# Patient Record
Sex: Female | Born: 1965 | Race: White | Hispanic: No | Marital: Married | Smoking: Current every day smoker
Health system: Southern US, Community
[De-identification: ages and names within clinical notes are randomized; demographics above are authoritative.]

## PROBLEM LIST (undated history)

## (undated) DIAGNOSIS — N289 Disorder of kidney and ureter, unspecified: Secondary | ICD-10-CM

## (undated) DIAGNOSIS — F419 Anxiety disorder, unspecified: Secondary | ICD-10-CM

## (undated) DIAGNOSIS — Z905 Acquired absence of kidney: Secondary | ICD-10-CM

## (undated) HISTORY — DX: Acquired absence of kidney: Z90.5

## (undated) HISTORY — DX: Anxiety disorder, unspecified: F41.9

## (undated) HISTORY — DX: Disorder of kidney and ureter, unspecified: N28.9

## (undated) HISTORY — PX: ABDOMINAL HYSTERECTOMY: SHX81

## (undated) HISTORY — PX: BLADDER SUSPENSION: SHX72

---

## 2005-12-01 HISTORY — PX: AUGMENTATION MAMMAPLASTY: SUR837

## 2014-12-01 HISTORY — PX: LAPAROSCOPIC GASTRIC SLEEVE RESECTION: SHX5895

## 2020-10-02 HISTORY — PX: RADIOFREQUENCY ABLATION NERVES: SUR1070

## 2021-02-11 ENCOUNTER — Ambulatory Visit: Payer: Self-pay | Admitting: Family Medicine

## 2021-02-13 ENCOUNTER — Other Ambulatory Visit: Payer: Self-pay

## 2021-02-13 ENCOUNTER — Ambulatory Visit (INDEPENDENT_AMBULATORY_CARE_PROVIDER_SITE_OTHER): Payer: 59

## 2021-02-13 ENCOUNTER — Ambulatory Visit (INDEPENDENT_AMBULATORY_CARE_PROVIDER_SITE_OTHER): Payer: 59 | Admitting: Family Medicine

## 2021-02-13 ENCOUNTER — Encounter: Payer: Self-pay | Admitting: Family Medicine

## 2021-02-13 VITALS — BP 119/80 | HR 92 | Temp 97.3°F | Ht 63.5 in | Wt 158.5 lb

## 2021-02-13 DIAGNOSIS — R911 Solitary pulmonary nodule: Secondary | ICD-10-CM | POA: Diagnosis not present

## 2021-02-13 DIAGNOSIS — F411 Generalized anxiety disorder: Secondary | ICD-10-CM | POA: Insufficient documentation

## 2021-02-13 DIAGNOSIS — M5412 Radiculopathy, cervical region: Secondary | ICD-10-CM

## 2021-02-13 DIAGNOSIS — Z87898 Personal history of other specified conditions: Secondary | ICD-10-CM

## 2021-02-13 DIAGNOSIS — G2581 Restless legs syndrome: Secondary | ICD-10-CM

## 2021-02-13 DIAGNOSIS — Z85820 Personal history of malignant melanoma of skin: Secondary | ICD-10-CM

## 2021-02-13 DIAGNOSIS — Z1231 Encounter for screening mammogram for malignant neoplasm of breast: Secondary | ICD-10-CM

## 2021-02-13 DIAGNOSIS — Z905 Acquired absence of kidney: Secondary | ICD-10-CM

## 2021-02-13 DIAGNOSIS — Z01419 Encounter for gynecological examination (general) (routine) without abnormal findings: Secondary | ICD-10-CM

## 2021-02-13 MED ORDER — ALPRAZOLAM 1 MG PO TABS: 1.0000 mg | ORAL_TABLET | Freq: Every evening | ORAL | 2 refills | Status: DC | PRN

## 2021-02-13 MED ORDER — GABAPENTIN 300 MG PO CAPS: 300.0000 mg | ORAL_CAPSULE | Freq: Three times a day (TID) | ORAL | 3 refills | Status: DC

## 2021-02-13 MED ORDER — OMEPRAZOLE 40 MG PO CPDR: 40.0000 mg | DELAYED_RELEASE_CAPSULE | Freq: Every day | ORAL | 1 refills | Status: DC

## 2021-02-13 NOTE — Assessment & Plan Note (Addendum)
She continues to have severe pain with radicular symptoms.  Updated MRI ordered.  She has norco which she uses sparingly.   Will increase gabapentin to 300mg  TID.

## 2021-02-13 NOTE — Patient Instructions (Signed)
Great to meet you today! Have labs completed You will be called to schedule imaging and referral appointments.  Increase gabapentin to 300mg  three times per day. See me again in 3 months.

## 2021-02-13 NOTE — Progress Notes (Signed)
Lisa Gill - 55 y.o. female MRN [id number]  Date of birth: [birth year]  Subjective Chief Complaint  Patient presents with  . Establish Care    HPI Lisa Gill is a 55 y.o. female here today for initial visit to establish care.  She has history of solitary kidney (secondary to kidney atrophy), GAD, chronic neck pain, nicotine dependence, history of melanoma and RLS.   She has had neck pain for a few years.  Pain is radicular in nature with radiation into the L arm.  She has tried PT, injections and nerve ablation without significant improvement.  She is currently taking gabapentin 100mg  TID.  She is prescribed norco however she is not taking this very often.  She get as good or better relief from ibuprofen.  She does try to limit her NSAIDS as well due to solitary kidney.     She has noticed strong odor to her urine.  She denies dysuria or frequency.    She is prescribed alprazolam for anxiety and insomnia.  She takes 1mg  qhs.  She does avoid taking this with pain medication.  She has trazodone as well for insomnia but this make her RLS worse.  She has tried several other medications for anxiety including buspar, vistaril, lexapro, fluoxetine, and bupropion.  She did not have any improvement with several of these.  She had side effects of worsening anxiety with bupropion and anorgasmia with lexapro.    She is a current smoker.  Smokes about 1ppd.  Reports previous nodule noted on previous xray in 2018 but this was never followed up.  Denies respiratory symptoms.   She requests referral to dermatology for history of melanoma.  Requests referral to GYN for female care/well woman exams.    ROS:  A comprehensive ROS was completed and negative except as noted per HPI  No Known Allergies  Past Medical History:  Diagnosis Date  . Anxiety   . Kidney disease    One fully functioning kidney    Past Surgical History:  Procedure Laterality Date  . ABDOMINAL HYSTERECTOMY    . BLADDER  SUSPENSION    . LAPAROSCOPIC GASTRIC SLEEVE RESECTION  12/01/2014  . RADIOFREQUENCY ABLATION NERVES  10/02/2020    Social History   Socioeconomic History  . Marital status: Married    Spouse name: Leshae Mcclay  . Number of children: Not on file  . Years of education: Not on file  . Highest education level: Not on file  Occupational History  . Occupation: Homemaker  Tobacco Use  . Smoking status: Current Every Day Smoker    Packs/day: 1.00    Years: 25.00    Pack years: 25.00    Types: Cigarettes  . Smokeless tobacco: Never Used  Vaping Use  . Vaping Use: Never used  Substance and Sexual Activity  . Alcohol use: Yes    Alcohol/week: 0.0 - 1.0 standard drinks    Comment: Social  . Drug use: Never  . Sexual activity: Yes    Partners: Male  Other Topics Concern  . Not on file  Social History Narrative  . Not on file   Social Determinants of Health   Financial Resource Strain: Not on file  Food Insecurity: Not on file  Transportation Needs: Not on file  Physical Activity: Not on file  Stress: Not on file  Social Connections: Not on file    Family History  Problem Relation Age of Onset  . Uterine cancer Mother   . Diabetes Maternal Grandmother  Health Maintenance  Topic Date Due  . Hepatitis C Screening  Never done  . HIV Screening  Never done  . TETANUS/TDAP  Never done  . PAP SMEAR-Modifier  Never done  . COLONOSCOPY (Pts 45-83yrs Insurance coverage will need to be confirmed)  Never done  . MAMMOGRAM  Never done  . COVID-19 Vaccine (3 - Booster for Pfizer series) 12/30/2020  . INFLUENZA VACCINE  04/01/2021 (Originally 07/01/2020)  . HPV VACCINES  Aged Out     ----------------------------------------------------------------------------------------------------------------------------------------------------------------------------------------------------------------- Physical Exam BP 119/80 (BP Location: Left Arm, Patient Position: Sitting, Cuff Size:  Normal)   Pulse 92   Temp (!) 97.3 F (36.3 C)   Ht 5' 3.5" (1.613 m)   Wt 158 lb 8 oz (71.9 kg)   SpO2 100%   BMI 27.64 kg/m   Physical Exam Constitutional:      Appearance: Normal appearance.  Eyes:     General: No scleral icterus. Cardiovascular:     Rate and Rhythm: Normal rate and regular rhythm.  Pulmonary:     Effort: Pulmonary effort is normal.     Breath sounds: Normal breath sounds.  Musculoskeletal:     Cervical back: Neck supple.  Skin:    General: Skin is warm and dry.  Neurological:     General: No focal deficit present.     Mental Status: She is alert.  Psychiatric:        Mood and Affect: Mood normal.        Behavior: Behavior normal.     ------------------------------------------------------------------------------------------------------------------------------------------------------------------------------------------------------------------- Assessment and Plan  GAD (generalized anxiety disorder) She will continue alprazolam qhs for now.  Ideally would like to have her on SSRI type medication.  We can consider trying trintellix since she has tried several other medications.    History of solitary pulmonary nodule Counseled on smoking cessation CXR ordered.   Solitary kidney, acquired Limit NSAIDS.  Update renal function and check UA.  Cervical radiculopathy She continues to have severe pain with radicular symptoms.  Updated MRI ordered.  She has norco which she uses sparingly.   Will increase gabapentin to 300mg  TID.   Restless legs Increase of gabapentin may be helpful.  Check iron panel and TSH  History of melanoma Referral placed to dermatology.    Meds ordered this encounter  Medications  . omeprazole (PRILOSEC) 40 MG capsule    Sig: Take 1 capsule (40 mg total) by mouth daily.    Dispense:  90 capsule    Refill:  1  . ALPRAZolam (XANAX) 1 MG tablet    Sig: Take 1 tablet (1 mg total) by mouth at bedtime as needed for  anxiety.    Dispense:  30 tablet    Refill:  2  . gabapentin (NEURONTIN) 300 MG capsule    Sig: Take 1 capsule (300 mg total) by mouth 3 (three) times daily.    Dispense:  90 capsule    Refill:  3    Return in about 3 months (around 05/16/2021) for RLS/anxiety.    This visit occurred during the SARS-CoV-2 public health emergency.  Safety protocols were in place, including screening questions prior to the visit, additional usage of staff PPE, and extensive cleaning of exam room while observing appropriate contact time as indicated for disinfecting solutions.

## 2021-02-13 NOTE — Assessment & Plan Note (Signed)
Referral placed to dermatology

## 2021-02-13 NOTE — Assessment & Plan Note (Signed)
Increase of gabapentin may be helpful.  Check iron panel and TSH

## 2021-02-13 NOTE — Assessment & Plan Note (Signed)
Limit NSAIDS.  Update renal function and check UA.

## 2021-02-13 NOTE — Assessment & Plan Note (Signed)
She will continue alprazolam qhs for now.  Ideally would like to have her on SSRI type medication.  We can consider trying trintellix since she has tried several other medications.

## 2021-02-13 NOTE — Assessment & Plan Note (Signed)
Counseled on smoking cessation CXR ordered.

## 2021-02-14 ENCOUNTER — Encounter: Payer: Self-pay | Admitting: Family Medicine

## 2021-02-14 LAB — CBC
HCT: 42.2 % (ref 35.0–45.0)
Hemoglobin: 14.3 g/dL (ref 11.7–15.5)
MCH: 30.3 pg (ref 27.0–33.0)
MCHC: 33.9 g/dL (ref 32.0–36.0)
MCV: 89.4 fL (ref 80.0–100.0)
MPV: 11.2 fL (ref 7.5–12.5)
Platelets: 272 10*3/uL (ref 140–400)
RBC: 4.72 10*6/uL (ref 3.80–5.10)
RDW: 13.2 % (ref 11.0–15.0)
WBC: 6.8 10*3/uL (ref 3.8–10.8)

## 2021-02-14 LAB — URINALYSIS, ROUTINE W REFLEX MICROSCOPIC
Bilirubin Urine: NEGATIVE
Glucose, UA: NEGATIVE
Hgb urine dipstick: NEGATIVE
Hyaline Cast: NONE SEEN /LPF
Ketones, ur: NEGATIVE
Nitrite: POSITIVE — AB
Protein, ur: NEGATIVE
RBC / HPF: NONE SEEN /HPF (ref 0–2)
Specific Gravity, Urine: 1.014 (ref 1.001–1.03)
Squamous Epithelial / HPF: NONE SEEN /HPF (ref <=5)
pH: 5.5 (ref 5.0–8.0)

## 2021-02-14 LAB — COMPLETE METABOLIC PANEL WITH GFR
AG Ratio: 2 (calc) (ref 1.0–2.5)
ALT: 14 U/L (ref 6–29)
AST: 18 U/L (ref 10–35)
Albumin: 4.5 g/dL (ref 3.6–5.1)
Alkaline phosphatase (APISO): 78 U/L (ref 37–153)
BUN: 19 mg/dL (ref 7–25)
CO2: 28 mmol/L (ref 20–32)
Calcium: 10.1 mg/dL (ref 8.6–10.4)
Chloride: 103 mmol/L (ref 98–110)
Creat: 0.99 mg/dL (ref 0.50–1.05)
GFR, Est African American: 75 mL/min/{1.73_m2} (ref >=60)
GFR, Est Non African American: 65 mL/min/{1.73_m2} (ref >=60)
Globulin: 2.3 g/dL (calc) (ref 1.9–3.7)
Glucose, Bld: 86 mg/dL (ref 65–99)
Potassium: 4.4 mmol/L (ref 3.5–5.3)
Sodium: 140 mmol/L (ref 135–146)
Total Bilirubin: 0.3 mg/dL (ref 0.2–1.2)
Total Protein: 6.8 g/dL (ref 6.1–8.1)

## 2021-02-14 LAB — IRON,TIBC AND FERRITIN PANEL
%SAT: 19 % (calc) (ref 16–45)
Ferritin: 10 ng/mL — ABNORMAL LOW (ref 16–232)
Iron: 108 ug/dL (ref 45–160)
TIBC: 567 mcg/dL (calc) — ABNORMAL HIGH (ref 250–450)

## 2021-02-14 LAB — TSH: TSH: 0.75 mIU/L

## 2021-02-20 ENCOUNTER — Ambulatory Visit (INDEPENDENT_AMBULATORY_CARE_PROVIDER_SITE_OTHER): Payer: 59

## 2021-02-20 ENCOUNTER — Other Ambulatory Visit: Payer: Self-pay

## 2021-02-20 DIAGNOSIS — Z1231 Encounter for screening mammogram for malignant neoplasm of breast: Secondary | ICD-10-CM | POA: Diagnosis not present

## 2021-03-02 ENCOUNTER — Ambulatory Visit (INDEPENDENT_AMBULATORY_CARE_PROVIDER_SITE_OTHER): Payer: 59

## 2021-03-02 ENCOUNTER — Other Ambulatory Visit: Payer: Self-pay

## 2021-03-02 DIAGNOSIS — M25512 Pain in left shoulder: Secondary | ICD-10-CM | POA: Diagnosis not present

## 2021-03-02 DIAGNOSIS — M25511 Pain in right shoulder: Secondary | ICD-10-CM

## 2021-03-02 DIAGNOSIS — M5412 Radiculopathy, cervical region: Secondary | ICD-10-CM

## 2021-03-02 DIAGNOSIS — R200 Anesthesia of skin: Secondary | ICD-10-CM

## 2021-03-02 DIAGNOSIS — M542 Cervicalgia: Secondary | ICD-10-CM | POA: Diagnosis not present

## 2021-03-07 ENCOUNTER — Encounter: Payer: 59 | Admitting: Obstetrics & Gynecology

## 2021-03-11 NOTE — Progress Notes (Signed)
It appears that you added on.  Culture never resulted.  Can we check on this?  Thanks!

## 2021-03-12 ENCOUNTER — Other Ambulatory Visit: Payer: Self-pay | Admitting: Family Medicine

## 2021-03-12 ENCOUNTER — Other Ambulatory Visit: Payer: Self-pay

## 2021-03-12 DIAGNOSIS — M47812 Spondylosis without myelopathy or radiculopathy, cervical region: Secondary | ICD-10-CM

## 2021-03-12 MED ORDER — FLUCONAZOLE 150 MG PO TABS: 150.0000 mg | ORAL_TABLET | Freq: Once | ORAL | 0 refills | Status: AC

## 2021-03-12 MED ORDER — NITROFURANTOIN MONOHYD MACRO 100 MG PO CAPS: 100.0000 mg | ORAL_CAPSULE | Freq: Two times a day (BID) | ORAL | 0 refills | Status: DC

## 2021-03-12 NOTE — Progress Notes (Signed)
Patient states she gets yeast infections from macrobid. Requested med.   Please advise

## 2021-03-12 NOTE — Progress Notes (Signed)
Fluconazole sent in.   CM

## 2021-03-14 ENCOUNTER — Other Ambulatory Visit: Payer: Self-pay

## 2021-03-14 MED ORDER — NITROFURANTOIN MONOHYD MACRO 100 MG PO CAPS
100.0000 mg | ORAL_CAPSULE | Freq: Two times a day (BID) | ORAL | 0 refills | Status: AC
Start: 2021-03-14 — End: 2021-03-21

## 2021-03-14 MED ORDER — FLUCONAZOLE 150 MG PO TABS: ORAL_TABLET | ORAL | 0 refills | Status: DC

## 2021-04-30 ENCOUNTER — Telehealth: Payer: Self-pay | Admitting: Family Medicine

## 2021-04-30 NOTE — Telephone Encounter (Signed)
Lisa Gill called and would like the preferred pharmacy for herself and her husband #[phone] changed. She would like prescriptions to go to [facility] on [address] in [place]. Lisa Gill is also in need of refills. Thank you

## 2021-05-01 ENCOUNTER — Other Ambulatory Visit: Payer: Self-pay

## 2021-05-01 DIAGNOSIS — G2581 Restless legs syndrome: Secondary | ICD-10-CM

## 2021-05-01 DIAGNOSIS — F411 Generalized anxiety disorder: Secondary | ICD-10-CM

## 2021-05-01 DIAGNOSIS — M5412 Radiculopathy, cervical region: Secondary | ICD-10-CM

## 2021-05-01 MED ORDER — ALPRAZOLAM 1 MG PO TABS: 1.0000 mg | ORAL_TABLET | Freq: Every evening | ORAL | 2 refills | Status: DC | PRN

## 2021-05-01 MED ORDER — OMEPRAZOLE 40 MG PO CPDR: 40.0000 mg | DELAYED_RELEASE_CAPSULE | Freq: Every day | ORAL | 2 refills | Status: AC

## 2021-05-01 MED ORDER — GABAPENTIN 300 MG PO CAPS: 300.0000 mg | ORAL_CAPSULE | Freq: Three times a day (TID) | ORAL | 1 refills | Status: DC

## 2021-05-01 NOTE — Telephone Encounter (Signed)
Prescriptions have been changed to new pharmacy. Canceled at old pharmacy. Contacted the patient to advise of refills.

## 2021-05-14 ENCOUNTER — Ambulatory Visit: Payer: 59 | Admitting: Family Medicine

## 2021-05-21 ENCOUNTER — Other Ambulatory Visit: Payer: Self-pay

## 2021-05-21 ENCOUNTER — Ambulatory Visit (INDEPENDENT_AMBULATORY_CARE_PROVIDER_SITE_OTHER): Payer: 59 | Admitting: Family Medicine

## 2021-05-21 ENCOUNTER — Encounter: Payer: Self-pay | Admitting: Family Medicine

## 2021-05-21 VITALS — BP 98/69 | HR 104 | Temp 98.4°F | Ht 63.5 in | Wt 149.0 lb

## 2021-05-21 DIAGNOSIS — R509 Fever, unspecified: Secondary | ICD-10-CM

## 2021-05-21 DIAGNOSIS — Z20822 Contact with and (suspected) exposure to covid-19: Secondary | ICD-10-CM

## 2021-05-21 DIAGNOSIS — M5412 Radiculopathy, cervical region: Secondary | ICD-10-CM

## 2021-05-21 DIAGNOSIS — J111 Influenza due to unidentified influenza virus with other respiratory manifestations: Secondary | ICD-10-CM

## 2021-05-21 DIAGNOSIS — F411 Generalized anxiety disorder: Secondary | ICD-10-CM | POA: Diagnosis not present

## 2021-05-21 LAB — POCT INFLUENZA A/B
Influenza A, POC: NEGATIVE
Influenza B, POC: NEGATIVE

## 2021-05-21 MED ORDER — ALPRAZOLAM 1 MG PO TABS: 1.0000 mg | ORAL_TABLET | Freq: Two times a day (BID) | ORAL | 2 refills | Status: DC | PRN

## 2021-05-23 LAB — SARS-COV-2, NAA 2 DAY TAT

## 2021-05-23 LAB — NOVEL CORONAVIRUS, NAA: SARS-CoV-2, NAA: NOT DETECTED

## 2021-05-26 DIAGNOSIS — J111 Influenza due to unidentified influenza virus with other respiratory manifestations: Secondary | ICD-10-CM | POA: Insufficient documentation

## 2021-05-26 NOTE — Assessment & Plan Note (Signed)
Stable with gabapentin.  She is also seeing Dr. Laurian Brim for interventional measures.

## 2021-05-26 NOTE — Assessment & Plan Note (Signed)
Will increase alprazolam to #45/month.  If needing more we discussed referral to psychiatry as she has tried multiple SSRI previously.

## 2021-05-26 NOTE — Progress Notes (Signed)
Lisa Gill - 55 y.o. female MRN [id number]  Date of birth: [birth year]  Subjective No chief complaint on file.   HPI Lisa Gill is a 55 y.o. female here today for follow up.   She is currently sick with febrile illness.  She has had fatigue, fever, chills, nausea, vomiting, diarrhea, headache body, aches and and congestion.  She has had negative COVID test x2 at home.  She is drinking fluids well but appetite is decreased.  No urinary symptoms.    She does report some increased stress recently.  The home they were planning on purchasing unfortunately fell through.  She is requesting a slight increase in number of alprazolam she is getting monthly to use during the day if needed.    She is seeing Dr. Laurian Brim for her neck.  Injection wasn't very helpful but she may try another. She is taking gabapentin for RLS and cervical radiculopathy.   ROS:  A comprehensive ROS was completed and negative except as noted per HPI  No Known Allergies  Past Medical History:  Diagnosis Date   Anxiety    Kidney disease    One fully functioning kidney   Solitary kidney, acquired     Past Surgical History:  Procedure Laterality Date   ABDOMINAL HYSTERECTOMY     AUGMENTATION MAMMAPLASTY Bilateral 2007   Saline   BLADDER SUSPENSION     LAPAROSCOPIC GASTRIC SLEEVE RESECTION  12/01/2014   RADIOFREQUENCY ABLATION NERVES  10/02/2020    Social History   Socioeconomic History   Marital status: Married    Spouse name: Keria Widrig   Number of children: Not on file   Years of education: Not on file   Highest education level: Not on file  Occupational History   Occupation: Homemaker  Tobacco Use   Smoking status: Every Day    Packs/day: 1.00    Years: 25.00    Pack years: 25.00    Types: Cigarettes   Smokeless tobacco: Never  Vaping Use   Vaping Use: Never used  Substance and Sexual Activity   Alcohol use: Yes    Alcohol/week: 0.0 - 1.0 standard drinks    Comment: Social   Drug  use: Never   Sexual activity: Yes    Partners: Male  Other Topics Concern   Not on file  Social History Narrative   Not on file   Social Determinants of Health   Financial Resource Strain: Not on file  Food Insecurity: Not on file  Transportation Needs: Not on file  Physical Activity: Not on file  Stress: Not on file  Social Connections: Not on file    Family History  Problem Relation Age of Onset   Uterine cancer Mother    Diabetes Maternal Grandmother     Health Maintenance  Topic Date Due   Pneumococcal Vaccine 6-4 Years old (1 - PCV) Never done   HIV Screening  Never done   Hepatitis C Screening  Never done   TETANUS/TDAP  Never done   PAP SMEAR-Modifier  Never done   COLONOSCOPY (Pts 45-69yrs Insurance coverage will need to be confirmed)  Never done   Zoster Vaccines- Shingrix (1 of 2) Never done   COVID-19 Vaccine (3 - Booster for Pfizer series) 11/29/2020   INFLUENZA VACCINE  07/01/2021   MAMMOGRAM  02/21/2023   HPV VACCINES  Aged Out     ----------------------------------------------------------------------------------------------------------------------------------------------------------------------------------------------------------------- Physical Exam BP 98/69 (BP Location: Right Arm, Patient Position: Sitting, Cuff Size: Normal)   Pulse (!) 104  Temp 98.4 F (36.9 C) (Oral)   Ht 5' 3.5" (1.613 m)   Wt 149 lb (67.6 kg)   SpO2 96%   BMI 25.98 kg/m   Physical Exam Constitutional:      Appearance: Normal appearance.  HENT:     Head: Normocephalic and atraumatic.     Right Ear: Tympanic membrane normal.     Left Ear: Tympanic membrane normal.  Eyes:     General: No scleral icterus. Cardiovascular:     Rate and Rhythm: Normal rate and regular rhythm.  Pulmonary:     Effort: Pulmonary effort is normal.     Breath sounds: Normal breath sounds.  Musculoskeletal:     Cervical back: Neck supple.  Neurological:     General: No focal deficit  present.     Mental Status: She is alert.  Psychiatric:        Mood and Affect: Mood normal.        Behavior: Behavior normal.    ------------------------------------------------------------------------------------------------------------------------------------------------------------------------------------------------------------------- Assessment and Plan  Cervical radiculopathy Stable with gabapentin.  She is also seeing Dr. Laurian Brim for interventional measures.    GAD (generalized anxiety disorder) Will increase alprazolam to #45/month.  If needing more we discussed referral to psychiatry as she has tried multiple SSRI previously.    Influenza-like illness Rapid flu negative.  COVID PCR sent today.  Recommend continued supportive care at home with increased fluids and rest.    Meds ordered this encounter  Medications   ALPRAZolam (XANAX) 1 MG tablet    Sig: Take 1 tablet (1 mg total) by mouth 2 (two) times daily as needed for anxiety.    Dispense:  45 tablet    Refill:  2    No follow-ups on file.    This visit occurred during the SARS-CoV-2 public health emergency.  Safety protocols were in place, including screening questions prior to the visit, additional usage of staff PPE, and extensive cleaning of exam room while observing appropriate contact time as indicated for disinfecting solutions.

## 2021-05-26 NOTE — Assessment & Plan Note (Signed)
Rapid flu negative.  COVID PCR sent today.  Recommend continued supportive care at home with increased fluids and rest.

## 2021-05-27 ENCOUNTER — Telehealth: Payer: Self-pay

## 2021-05-27 ENCOUNTER — Other Ambulatory Visit: Payer: Self-pay | Admitting: Family Medicine

## 2021-05-27 DIAGNOSIS — R197 Diarrhea, unspecified: Secondary | ICD-10-CM

## 2021-05-27 NOTE — Telephone Encounter (Signed)
Patient has left 2 messages today concerning the following symptoms:  Fecal incontinence. 20 bowel movements per day. Unable to hold stool (accidents).   She wonders if it could be c-diff.   States she left a phone message with Triage on Friday morning and did not receive a callback.

## 2021-05-27 NOTE — Telephone Encounter (Signed)
Returned patient's call.   Apologized for not receiving Friday's call from Triage. Called pt back today as soon as possible due to volume.   Advised order for c-diff testing was placed and to pick-up testing supplies from [facility] prior to 5 pm.   Callback information provided.

## 2021-05-27 NOTE — Telephone Encounter (Signed)
Orders for stool test entered.

## 2021-05-29 ENCOUNTER — Telehealth: Payer: Self-pay | Admitting: Family Medicine

## 2021-05-29 LAB — C. DIFFICILE GDH AND TOXIN A/B
GDH ANTIGEN: NOT DETECTED
MICRO NUMBER:: 12060900
SPECIMEN QUALITY:: ADEQUATE
TOXIN A AND B: NOT DETECTED

## 2021-05-29 NOTE — Telephone Encounter (Signed)
Received transferred call from [employer].   Patient informed me of her continued symptoms. States she had an incontinence accident this morning while walking her dog. Experiencing diarrhea x 2 weeks. Trying to stay hydrated and is eating. She's taking probiotics and eating yogurt. Taking Imodium.   Per Dr. Ashley Royalty, advised patient to add Pepto Bismol to help slow down the diarrhea. Advised her to stay hydrated with Pedialyte and Gatorade.  Results from the lab will probably not be received until next week due to overload at Labs.   Someone will call her with his medication recommendations and results.   If she is still feeling unwell and unable to wait for results please go to Urgent Care for immediate treatment.   Patient stated that she knew she was rude to [employer] and myself. She "probably owes [place] an apology but she is feeling unwell."  While waiting on responses from Dr. Ashley Royalty the patient was heard stating, "This was BS. And she might as well go to the ER." She also stated that I was curt with her and she didn't appreciate it. I apologized if she felt that way but it was not my intent. She stated the call was being recorded and she would make sure Dr. Anastasio Auerbach heard it.   (Dr. Ashley Royalty was listening to my call with the patient during the call). Patient was very rude and seemed entitled. She stated that regardless of how rude she was we should provide her with what she needed and ignore her rudeness.

## 2021-05-29 NOTE — Telephone Encounter (Signed)
Patient not feeling good. Has lost a lot of weight and having a lot of diarrhea. States she has left a voicemail for CMA, Panya but has not got a call back with results. Please contact her and advise.

## 2021-05-29 NOTE — Telephone Encounter (Signed)
Did she have C. Diff test completed?

## 2021-05-29 NOTE — Telephone Encounter (Signed)
Patient called in stating that she called this morning and that no one has called her back. She stated for me to "get up off my ass and get someone on the phone asap" that she is sick as a dog and will not put up with this "shit". She stated shes been in our clinic before and its not that "GD busy". Patient said now go, go and get Dr.Matthews to speak to me on the phone right now. Patient was upset and nasty on the phone. Transferred call to CMA, Panya.

## 2021-06-19 ENCOUNTER — Telehealth: Payer: Self-pay | Admitting: Neurology

## 2021-06-19 NOTE — Telephone Encounter (Signed)
A PA had been started for Xanax on Covermymeds.  Response: "Member should be able to get the drug/product without a PA at this time."

## 2021-09-02 ENCOUNTER — Other Ambulatory Visit: Payer: Self-pay | Admitting: Family Medicine

## 2021-09-02 DIAGNOSIS — F411 Generalized anxiety disorder: Secondary | ICD-10-CM

## 2021-09-02 DIAGNOSIS — M5412 Radiculopathy, cervical region: Secondary | ICD-10-CM

## 2021-09-02 DIAGNOSIS — G2581 Restless legs syndrome: Secondary | ICD-10-CM

## 2021-10-30 ENCOUNTER — Other Ambulatory Visit: Payer: Self-pay | Admitting: Family Medicine

## 2021-10-30 DIAGNOSIS — F411 Generalized anxiety disorder: Secondary | ICD-10-CM

## 2021-12-19 ENCOUNTER — Other Ambulatory Visit: Payer: Self-pay | Admitting: Family Medicine

## 2021-12-19 DIAGNOSIS — F411 Generalized anxiety disorder: Secondary | ICD-10-CM

## 2021-12-31 ENCOUNTER — Ambulatory Visit: Payer: 59 | Admitting: Family Medicine

## 2021-12-31 ENCOUNTER — Telehealth: Payer: Self-pay

## 2021-12-31 DIAGNOSIS — Z124 Encounter for screening for malignant neoplasm of cervix: Secondary | ICD-10-CM

## 2021-12-31 DIAGNOSIS — F411 Generalized anxiety disorder: Secondary | ICD-10-CM

## 2021-12-31 DIAGNOSIS — Z1211 Encounter for screening for malignant neoplasm of colon: Secondary | ICD-10-CM

## 2021-12-31 NOTE — Telephone Encounter (Signed)
Requesting referral to GI concerning abd pain possibly concerning previous gastric sleeve procedure.   Requesting referral to OB/GYN due to vaginal issues.  Requesting referral to Psych for depression and anxiety. Possibly restarting Lexapro.  Please order routine labs for completion.

## 2022-01-01 NOTE — Addendum Note (Signed)
Addended by: Perlie Mayo on: 01/01/2022 04:35 PM   Modules accepted: Orders

## 2022-01-01 NOTE — Telephone Encounter (Signed)
Completed.

## 2022-01-14 ENCOUNTER — Ambulatory Visit (INDEPENDENT_AMBULATORY_CARE_PROVIDER_SITE_OTHER): Payer: 59 | Admitting: Family Medicine

## 2022-01-14 ENCOUNTER — Encounter: Payer: Self-pay | Admitting: Family Medicine

## 2022-01-14 ENCOUNTER — Other Ambulatory Visit: Payer: Self-pay

## 2022-01-14 VITALS — Ht 63.5 in | Wt 172.0 lb

## 2022-01-14 DIAGNOSIS — G2581 Restless legs syndrome: Secondary | ICD-10-CM | POA: Diagnosis not present

## 2022-01-14 DIAGNOSIS — M5412 Radiculopathy, cervical region: Secondary | ICD-10-CM

## 2022-01-14 DIAGNOSIS — Z13220 Encounter for screening for lipoid disorders: Secondary | ICD-10-CM

## 2022-01-14 DIAGNOSIS — F5101 Primary insomnia: Secondary | ICD-10-CM

## 2022-01-14 DIAGNOSIS — F411 Generalized anxiety disorder: Secondary | ICD-10-CM | POA: Diagnosis not present

## 2022-01-14 DIAGNOSIS — Z0000 Encounter for general adult medical examination without abnormal findings: Secondary | ICD-10-CM

## 2022-01-14 DIAGNOSIS — F17210 Nicotine dependence, cigarettes, uncomplicated: Secondary | ICD-10-CM

## 2022-01-14 DIAGNOSIS — Z1329 Encounter for screening for other suspected endocrine disorder: Secondary | ICD-10-CM

## 2022-01-14 DIAGNOSIS — G4700 Insomnia, unspecified: Secondary | ICD-10-CM | POA: Insufficient documentation

## 2022-01-14 MED ORDER — GABAPENTIN 300 MG PO CAPS: ORAL_CAPSULE | ORAL | 1 refills | Status: DC

## 2022-01-14 MED ORDER — TRAZODONE HCL 100 MG PO TABS: 100.0000 mg | ORAL_TABLET | Freq: Every evening | ORAL | 3 refills | Status: AC | PRN

## 2022-01-14 MED ORDER — VORTIOXETINE HBR 10 MG PO TABS: 10.0000 mg | ORAL_TABLET | Freq: Every day | ORAL | 3 refills | Status: DC

## 2022-01-14 NOTE — Progress Notes (Signed)
Lisa Gill - 56 y.o. female MRN [id number]  Date of birth: 1965-12-03  Subjective Chief Complaint  Patient presents with   Dizziness    HPI Lisa Gill is a 56 year old female here today for follow-up visit.  She recently reacquired insurance and needs updated orders and new referrals.  Continues to have uncontrolled anxiety.  She was taking Lexapro previously she would like to restart this but did note that she had sexual side effects with this.  She asked if there are any alternatives to this.  She does have a prescription for alprazolam which she utilizes as needed.  She has used her husband's trazodone occasionally to help with sleep which works well for her.  She does have a pending referral to psychiatry.  She is new referral back to Dr. Laurian Brim with pain management.  History of cervical radiculopathy.  She needs renewal of her gabapentin as well.  This was working well for her to help manage symptoms.  She is a current daily smoker.  She has smoked 1 to 1-1/2 packs/day for nearly 40 years.  She is requesting lung cancer screening.  She would like to have updated labs ordered today.  ROS:  A comprehensive ROS was completed and negative except as noted per HPI    Past Medical History:  Diagnosis Date   Anxiety    Kidney disease    One fully functioning kidney   Solitary kidney, acquired     Past Surgical History:  Procedure Laterality Date   ABDOMINAL HYSTERECTOMY     AUGMENTATION MAMMAPLASTY Bilateral 2007   Saline   BLADDER SUSPENSION     LAPAROSCOPIC GASTRIC SLEEVE RESECTION  12/01/2014   RADIOFREQUENCY ABLATION NERVES  10/02/2020    Social History   Socioeconomic History   Marital status: Married    Spouse name: Lisa Gill   Number of children: Not on file   Years of education: Not on file   Highest education level: Not on file  Occupational History   Occupation: Homemaker  Tobacco Use   Smoking status: Every Day    Packs/day: 1.00    Years: 25.00     Pack years: 25.00    Types: Cigarettes   Smokeless tobacco: Never  Vaping Use   Vaping Use: Never used  Substance and Sexual Activity   Alcohol use: Yes    Alcohol/week: 0.0 - 1.0 standard drinks    Comment: Social   Drug use: Never   Sexual activity: Yes    Partners: Male  Other Topics Concern   Not on file  Social History Narrative   Not on file   Social Determinants of Health   Financial Resource Strain: Not on file  Food Insecurity: Not on file  Transportation Needs: Not on file  Physical Activity: Not on file  Stress: Not on file  Social Connections: Not on file    Family History  Problem Relation Age of Onset   Uterine cancer Mother    Diabetes Maternal Grandmother     Health Maintenance  Topic Date Due   HIV Screening  Never done   Hepatitis C Screening  Never done   PAP SMEAR-Modifier  Never done   COLONOSCOPY (Pts 45-40yrs Insurance coverage will need to be confirmed)  Never done   INFLUENZA VACCINE  02/28/2022 (Originally 07/01/2021)   TETANUS/TDAP  01/14/2023 (Originally 08/10/1985)   Zoster Vaccines- Shingrix (1 of 2) 04/14/2023 (Originally 08/10/2016)   MAMMOGRAM  02/21/2023   COVID-19 Vaccine  Completed   HPV VACCINES  Aged Out     ----------------------------------------------------------------------------------------------------------------------------------------------------------------------------------------------------------------- Physical Exam Ht 5' 3.5" (1.613 m)    Wt 172 lb (78 kg)    SpO2 97%    BMI 29.99 kg/m   Physical Exam Constitutional:      Appearance: Normal appearance.  Eyes:     General: No scleral icterus. Cardiovascular:     Rate and Rhythm: Normal rate and regular rhythm.  Pulmonary:     Effort: Pulmonary effort is normal.     Breath sounds: Normal breath sounds.  Musculoskeletal:     Cervical back: Neck supple.  Skin:    General: Skin is warm.  Neurological:     General: No focal deficit present.     Mental  Status: She is alert.  Psychiatric:        Mood and Affect: Mood normal.        Behavior: Behavior normal.    ------------------------------------------------------------------------------------------------------------------------------------------------------------------------------------------------------------------- Assessment and Plan  GAD (generalized anxiety disorder) She has done well with Lexapro for management of symptoms however had side effects with this.  Would prefer to try something different, starting Trintellix at 10 mg daily.  She does have pending referral to psychiatry as well.  Cervical radiculopathy Gabapentin added back on.  Referral placed back to Dr. Laurian Brim for injections.  Smokes less than 1 pack a day with greater than 20 pack year history Orders placed for low-dose CT for lung cancer screening.  Smoking cessation advised.  Insomnia Adding trazodone 100 mg nightly.   Meds ordered this encounter  Medications   gabapentin (NEURONTIN) 300 MG capsule    Sig: TAKE 1 CAPSULE(300 MG) BY MOUTH THREE TIMES DAILY    Dispense:  90 capsule    Refill:  1   vortioxetine HBr (TRINTELLIX) 10 MG TABS tablet    Sig: Take 1 tablet (10 mg total) by mouth daily.    Dispense:  30 tablet    Refill:  3   traZODone (DESYREL) 100 MG tablet    Sig: Take 1 tablet (100 mg total) by mouth at bedtime as needed for sleep.    Dispense:  90 tablet    Refill:  3    Return in about 6 weeks (around 02/25/2022) for depression/anxiety.    This visit occurred during the SARS-CoV-2 public health emergency.  Safety protocols were in place, including screening questions prior to the visit, additional usage of staff PPE, and extensive cleaning of exam room while observing appropriate contact time as indicated for disinfecting solutions.

## 2022-01-14 NOTE — Assessment & Plan Note (Signed)
Orders placed for low-dose CT for lung cancer screening.  Smoking cessation advised.

## 2022-01-14 NOTE — Patient Instructions (Signed)
Let's try trintellix to replace lexapro.   See me again in about 4-6 weeks.  You will be contacted to set up lung cancer screening.

## 2022-01-14 NOTE — Assessment & Plan Note (Signed)
She has done well with Lexapro for management of symptoms however had side effects with this.  Would prefer to try something different, starting Trintellix at 10 mg daily.  She does have pending referral to psychiatry as well.

## 2022-01-14 NOTE — Assessment & Plan Note (Signed)
Adding trazodone 100 mg nightly.

## 2022-01-14 NOTE — Assessment & Plan Note (Signed)
Gabapentin added back on.  Referral placed back to Dr. Laurian Brim for injections.

## 2022-01-15 LAB — LIPID PANEL
Cholesterol: 198 mg/dL (ref <200)
HDL: 69 mg/dL (ref >=50)
LDL Cholesterol (Calc): 108 mg/dL (calc) — ABNORMAL HIGH
Non-HDL Cholesterol (Calc): 129 mg/dL (calc) (ref <130)
Total CHOL/HDL Ratio: 2.9 (calc) (ref <5.0)
Triglycerides: 109 mg/dL (ref <150)

## 2022-01-15 LAB — CBC
HCT: 42.5 % (ref 35.0–45.0)
Hemoglobin: 13.8 g/dL (ref 11.7–15.5)
MCH: 30.5 pg (ref 27.0–33.0)
MCHC: 32.5 g/dL (ref 32.0–36.0)
MCV: 94 fL (ref 80.0–100.0)
MPV: 11.7 fL (ref 7.5–12.5)
Platelets: 229 10*3/uL (ref 140–400)
RBC: 4.52 10*6/uL (ref 3.80–5.10)
RDW: 12.7 % (ref 11.0–15.0)
WBC: 4.2 10*3/uL (ref 3.8–10.8)

## 2022-01-15 LAB — COMPLETE METABOLIC PANEL WITH GFR
AG Ratio: 2 (calc) (ref 1.0–2.5)
ALT: 14 U/L (ref 6–29)
AST: 16 U/L (ref 10–35)
Albumin: 4.2 g/dL (ref 3.6–5.1)
Alkaline phosphatase (APISO): 72 U/L (ref 37–153)
BUN: 10 mg/dL (ref 7–25)
CO2: 27 mmol/L (ref 20–32)
Calcium: 9.7 mg/dL (ref 8.6–10.4)
Chloride: 106 mmol/L (ref 98–110)
Creat: 0.94 mg/dL (ref 0.50–1.03)
Globulin: 2.1 g/dL (calc) (ref 1.9–3.7)
Glucose, Bld: 87 mg/dL (ref 65–99)
Potassium: 4.9 mmol/L (ref 3.5–5.3)
Sodium: 141 mmol/L (ref 135–146)
Total Bilirubin: 0.3 mg/dL (ref 0.2–1.2)
Total Protein: 6.3 g/dL (ref 6.1–8.1)
eGFR: 72 mL/min/{1.73_m2} (ref >=60)

## 2022-01-15 LAB — TSH: TSH: 0.64 mIU/L

## 2022-01-16 ENCOUNTER — Telehealth: Payer: Self-pay

## 2022-01-16 NOTE — Telephone Encounter (Signed)
Initiated Prior authorization VCB:[id number]) 10 MG TABS  Via: Covermymeds Case/Key:[id number] - [place] Case ID: [id number] Status: approved as of 01/16/22 Reason:Status: Approved, Coverage Starts on: 01/16/2022 12:00 AM, Coverage Ends on: 01/16/2023 Notified Pt via: Mychart

## 2022-01-21 ENCOUNTER — Ambulatory Visit: Payer: 59 | Admitting: Gastroenterology

## 2022-01-21 ENCOUNTER — Other Ambulatory Visit: Payer: Self-pay

## 2022-01-22 ENCOUNTER — Telehealth: Payer: Self-pay

## 2022-01-22 MED ORDER — ESCITALOPRAM OXALATE 20 MG PO TABS: 20.0000 mg | ORAL_TABLET | Freq: Every day | ORAL | 1 refills | Status: AC

## 2022-01-22 NOTE — Telephone Encounter (Signed)
Lexapro reordered

## 2022-01-22 NOTE — Telephone Encounter (Signed)
Pt lvm stating Trintellix copay $500.  She would like to change to Lexapro 20mg  (prev dosage).  Thanks

## 2022-01-28 ENCOUNTER — Other Ambulatory Visit: Payer: Self-pay

## 2022-01-28 ENCOUNTER — Other Ambulatory Visit (HOSPITAL_COMMUNITY)
Admission: RE | Admit: 2022-01-28 | Discharge: 2022-01-28 | Disposition: A | Discharge status: Home / Self Care | Payer: 59 | Source: Ambulatory Visit | Attending: Obstetrics and Gynecology | Admitting: Obstetrics and Gynecology

## 2022-01-28 ENCOUNTER — Encounter: Payer: Self-pay | Admitting: Obstetrics and Gynecology

## 2022-01-28 ENCOUNTER — Ambulatory Visit (INDEPENDENT_AMBULATORY_CARE_PROVIDER_SITE_OTHER): Payer: 59 | Admitting: Obstetrics and Gynecology

## 2022-01-28 VITALS — BP 113/82 | HR 86 | Ht 64.0 in | Wt 174.0 lb

## 2022-01-28 DIAGNOSIS — Z01419 Encounter for gynecological examination (general) (routine) without abnormal findings: Secondary | ICD-10-CM | POA: Insufficient documentation

## 2022-01-28 NOTE — Progress Notes (Signed)
Subjective:     Lisa Gill is a 56 y.o. female P2 with BMI 28 postmenopausal who is here for a comprehensive physical exam. The patient reports no problems. Patient is sexually active without complaints. She recently relocated from [facility] and was on a hormonal regimen of testosterone to help with libido. Patient desires to be restarted on it. Patient had a hysterectomy due to symptomatic fibroid uterus. Patient denies pelvic pain or abnormal discharge. She has had a sling procedure for urinary incontinence and admits to urinary leakage after prolong retention. Patient is otherwise without complaints.   Past Medical History:  Diagnosis Date   Anxiety    Kidney disease    One fully functioning kidney   Solitary kidney, acquired    Past Surgical History:  Procedure Laterality Date   ABDOMINAL HYSTERECTOMY     AUGMENTATION MAMMAPLASTY Bilateral 2007   Saline   BLADDER SUSPENSION     LAPAROSCOPIC GASTRIC SLEEVE RESECTION  12/01/2014   RADIOFREQUENCY ABLATION NERVES  10/02/2020   Family History  Problem Relation Age of Onset   Uterine cancer Mother    Diabetes Maternal Grandmother     Social History   Socioeconomic History   Marital status: Married    Spouse name: Trishna Cwik   Number of children: Not on file   Years of education: Not on file   Highest education level: Not on file  Occupational History   Occupation: Homemaker  Tobacco Use   Smoking status: Every Day    Packs/day: 1.00    Years: 25.00    Pack years: 25.00    Types: Cigarettes   Smokeless tobacco: Never  Vaping Use   Vaping Use: Never used  Substance and Sexual Activity   Alcohol use: Yes    Alcohol/week: 0.0 - 1.0 standard drinks    Comment: Social   Drug use: Never   Sexual activity: Yes    Partners: Male  Other Topics Concern   Not on file  Social History Narrative   Not on file   Social Determinants of Health   Financial Resource Strain: Not on file  Food Insecurity: Not on file   Transportation Needs: Not on file  Physical Activity: Not on file  Stress: Not on file  Social Connections: Not on file  Intimate Partner Violence: Not on file   Health Maintenance  Topic Date Due   HIV Screening  Never done   Hepatitis C Screening  Never done   PAP SMEAR-Modifier  Never done   COLONOSCOPY (Pts 45-83yrs Insurance coverage will need to be confirmed)  Never done   INFLUENZA VACCINE  02/28/2022 (Originally 07/01/2021)   TETANUS/TDAP  01/14/2023 (Originally 08/10/1985)   Zoster Vaccines- Shingrix (1 of 2) 04/14/2023 (Originally 08/10/2016)   MAMMOGRAM  02/21/2023   COVID-19 Vaccine  Completed   HPV VACCINES  Aged Out       Review of Systems Pertinent items noted in HPI and remainder of comprehensive ROS otherwise negative.   Objective:  Blood pressure 113/82, pulse 86, height 5\' 4"  (1.626 m), weight 174 lb (78.9 kg).   GENERAL: Well-developed, well-nourished female in no acute distress.  HEENT: Normocephalic, atraumatic. Sclerae anicteric.  NECK: Supple. Normal thyroid.  LUNGS: Clear to auscultation bilaterally.  HEART: Regular rate and rhythm. BREASTS: Symmetric in size. No palpable masses or lymphadenopathy, skin changes, or nipple drainage. Breast implant in place ABDOMEN: Soft, nontender, nondistended. No organomegaly. PELVIC: Normal external female genitalia. Vagina is pink and rugated.  Normal discharge. Normal appearing cervix.  No adnexal mass or tenderness. Chaperone present during the pelvic exam EXTREMITIES: No cyanosis, clubbing, or edema, 2+ distal pulses.     Assessment:    Healthy female exam.      Plan:    Pap smears collected Screening mammogram ordered Patient will contact us with testosterone dosage for refill Patient will be contacted with abnormal results See After Visit Summary for Counseling Recommendations

## 2022-01-29 ENCOUNTER — Ambulatory Visit (INDEPENDENT_AMBULATORY_CARE_PROVIDER_SITE_OTHER): Payer: Self-pay | Admitting: Psychiatry

## 2022-01-29 ENCOUNTER — Encounter (HOSPITAL_COMMUNITY): Payer: Self-pay | Admitting: Psychiatry

## 2022-01-29 DIAGNOSIS — F332 Major depressive disorder, recurrent severe without psychotic features: Secondary | ICD-10-CM

## 2022-01-29 DIAGNOSIS — F5102 Adjustment insomnia: Secondary | ICD-10-CM

## 2022-01-29 DIAGNOSIS — F411 Generalized anxiety disorder: Secondary | ICD-10-CM

## 2022-01-29 LAB — CYTOLOGY - PAP
Comment: NEGATIVE
Diagnosis: NEGATIVE
High risk HPV: NEGATIVE
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
## 2022-01-30 ENCOUNTER — Other Ambulatory Visit: Payer: Self-pay

## 2022-01-30 ENCOUNTER — Ambulatory Visit (INDEPENDENT_AMBULATORY_CARE_PROVIDER_SITE_OTHER): Payer: 59

## 2022-01-30 DIAGNOSIS — F17210 Nicotine dependence, cigarettes, uncomplicated: Secondary | ICD-10-CM

## 2022-02-06 ENCOUNTER — Encounter: Payer: Self-pay | Admitting: Obstetrics and Gynecology

## 2022-02-10 ENCOUNTER — Other Ambulatory Visit: Payer: Self-pay | Admitting: Obstetrics and Gynecology

## 2022-02-10 ENCOUNTER — Other Ambulatory Visit: Payer: Self-pay | Admitting: Family Medicine

## 2022-02-10 DIAGNOSIS — Z1231 Encounter for screening mammogram for malignant neoplasm of breast: Secondary | ICD-10-CM

## 2022-02-10 MED ORDER — SILDENAFIL CITRATE 100 MG PO TABS: 100.0000 mg | ORAL_TABLET | Freq: Every day | ORAL | 6 refills | Status: AC | PRN

## 2022-02-10 MED ORDER — MEDROXYPROGESTERONE ACETATE 10 MG PO TABS: 10.0000 mg | ORAL_TABLET | Freq: Every day | ORAL | 6 refills | Status: AC

## 2022-02-10 MED ORDER — ESTRADIOL 0.1 MG/GM VA CREA: TOPICAL_CREAM | VAGINAL | 12 refills | Status: DC

## 2022-02-11 ENCOUNTER — Ambulatory Visit: Payer: 59 | Admitting: Family Medicine

## 2022-02-13 ENCOUNTER — Encounter: Payer: Self-pay | Admitting: Family Medicine

## 2022-02-13 ENCOUNTER — Other Ambulatory Visit: Payer: Self-pay | Admitting: *Deleted

## 2022-02-13 MED ORDER — ESTRADIOL 0.1 MG/GM VA CREA: TOPICAL_CREAM | VAGINAL | 12 refills | Status: AC

## 2022-02-20 ENCOUNTER — Other Ambulatory Visit: Payer: Self-pay

## 2022-02-20 ENCOUNTER — Other Ambulatory Visit: Payer: Self-pay | Admitting: Family Medicine

## 2022-02-20 DIAGNOSIS — F411 Generalized anxiety disorder: Secondary | ICD-10-CM

## 2022-02-20 MED ORDER — ALPRAZOLAM 1 MG PO TABS: 1.0000 mg | ORAL_TABLET | Freq: Every evening | ORAL | 1 refills | Status: DC | PRN

## 2022-02-20 NOTE — Progress Notes (Signed)
Completed.

## 2022-02-26 ENCOUNTER — Encounter: Payer: Self-pay | Admitting: Obstetrics and Gynecology

## 2022-02-27 ENCOUNTER — Other Ambulatory Visit: Payer: Self-pay | Admitting: Obstetrics and Gynecology

## 2022-02-27 MED ORDER — FLUCONAZOLE 150 MG PO TABS: 150.0000 mg | ORAL_TABLET | Freq: Once | ORAL | 4 refills | Status: AC

## 2022-03-03 ENCOUNTER — Ambulatory Visit (HOSPITAL_COMMUNITY): Payer: Self-pay | Admitting: Licensed Clinical Social Worker

## 2022-03-03 NOTE — Progress Notes (Signed)
Therapist contacted patient by text for session and she did not respond session is a no show 

## 2022-03-06 ENCOUNTER — Ambulatory Visit (HOSPITAL_COMMUNITY): Payer: Self-pay | Admitting: Psychiatry

## 2022-03-09 ENCOUNTER — Other Ambulatory Visit: Payer: Self-pay | Admitting: Family Medicine

## 2022-03-09 DIAGNOSIS — G2581 Restless legs syndrome: Secondary | ICD-10-CM

## 2022-03-09 DIAGNOSIS — M5412 Radiculopathy, cervical region: Secondary | ICD-10-CM

## 2022-05-15 ENCOUNTER — Other Ambulatory Visit: Payer: Self-pay

## 2022-05-15 DIAGNOSIS — F411 Generalized anxiety disorder: Secondary | ICD-10-CM

## 2022-05-16 MED ORDER — ALPRAZOLAM 1 MG PO TABS: 1.0000 mg | ORAL_TABLET | Freq: Every evening | ORAL | 1 refills | Status: AC | PRN

## 2022-05-16 NOTE — Progress Notes (Signed)
Orders completed

## 2022-06-02 ENCOUNTER — Telehealth: Payer: Self-pay | Admitting: Family Medicine

## 2022-06-02 NOTE — Telephone Encounter (Signed)
Pt called.  Her new pharmacy is [facility] [phone], [address] [place] [address] Tel # [phone].

## 2022-06-02 NOTE — Telephone Encounter (Signed)
LVM for patient callback. Verifying if patient has permanently moved to [place]. Dr. Ashley Royalty cannot write controlled substance Rx's out of the state. Thank you

## 2022-06-13 ENCOUNTER — Other Ambulatory Visit: Payer: Self-pay

## 2022-06-13 DIAGNOSIS — G2581 Restless legs syndrome: Secondary | ICD-10-CM

## 2022-06-13 DIAGNOSIS — M5412 Radiculopathy, cervical region: Secondary | ICD-10-CM

## 2022-06-13 MED ORDER — GABAPENTIN 300 MG PO CAPS: 300.0000 mg | ORAL_CAPSULE | Freq: Three times a day (TID) | ORAL | 2 refills | Status: AC

## 2023-12-01 IMAGING — CT CT CHEST LUNG CANCER SCREENING LOW DOSE W/O CM
2 of 4 series · 15 of 36 positions shown, 18 images · non-contrast
Comparison: None.

CLINICAL DATA: Current smoker with 40 pack-year history



[Series 3: lungs · axial · 0.61mm/px · z∈[-324,-57]mm · 12 of 295 slices shown, 15 images]
[im 14/295  mediastinal]
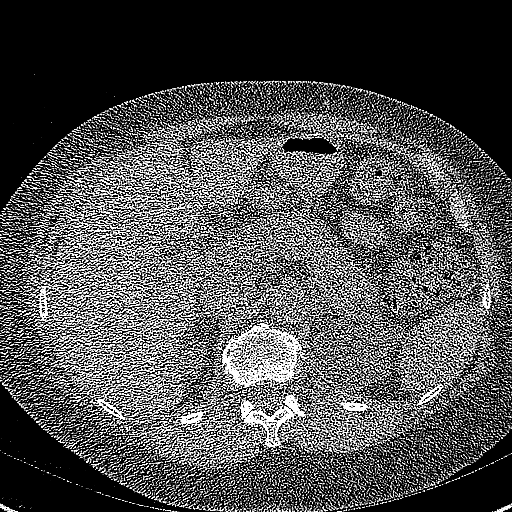
[im 14/295  lung]
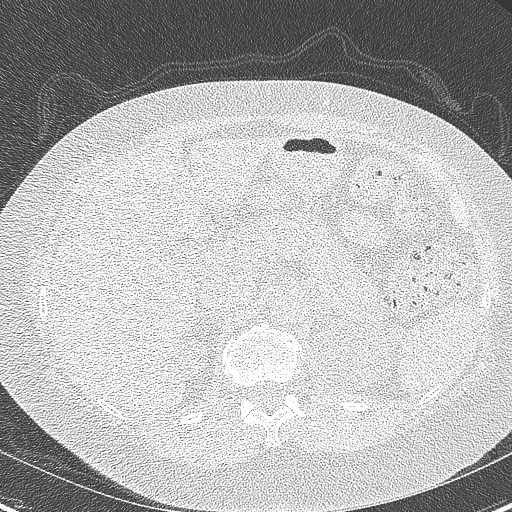
[im 41/295  lung]
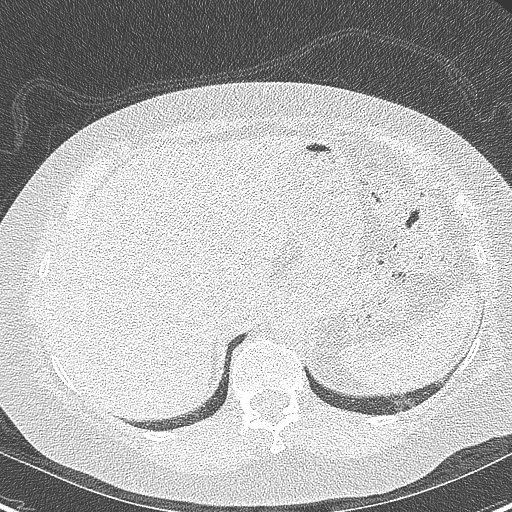
[im 67/295  lung]
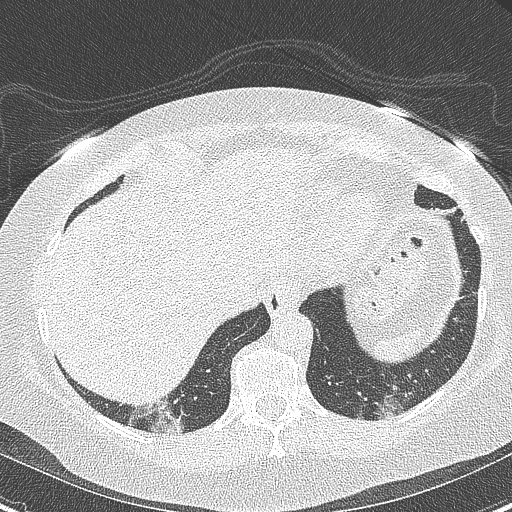
[im 94/295  lung]
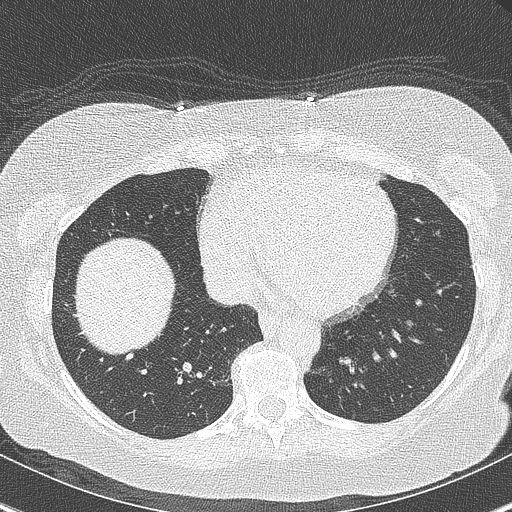
[im 107/295  mediastinal]
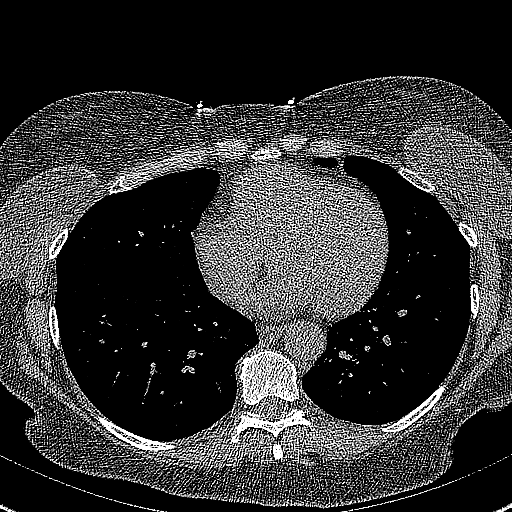
[im 107/295  lung]
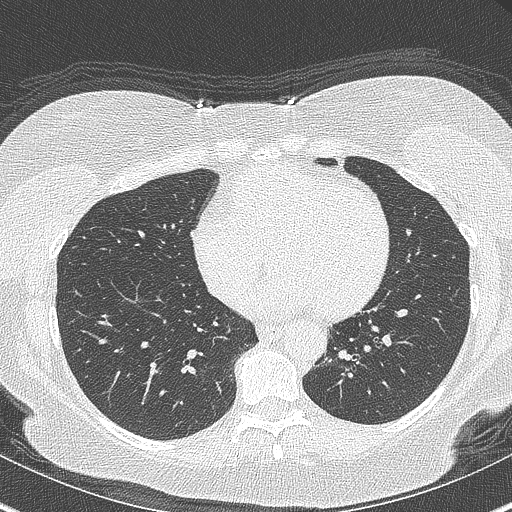
[im 134/295  lung]
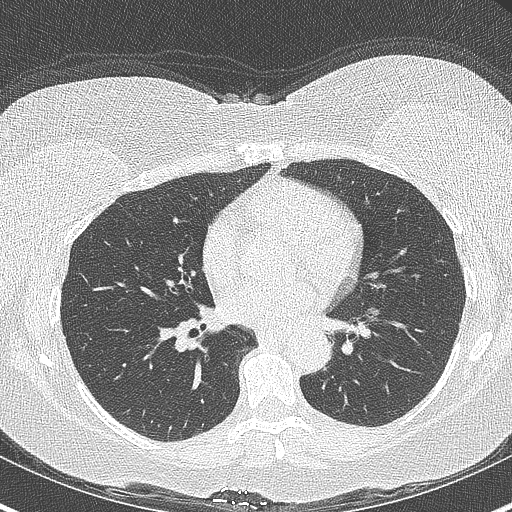
[im 161/295  lung]
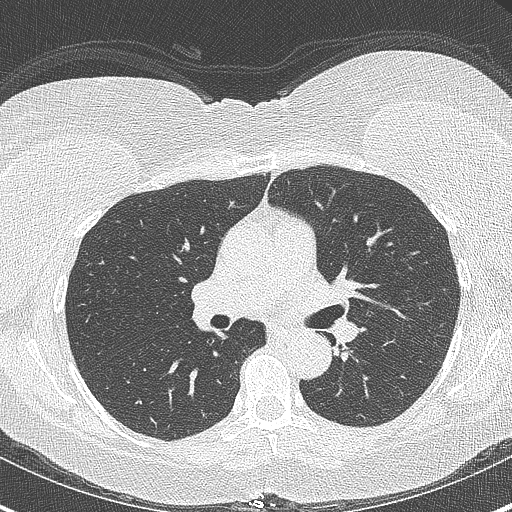
[im 188/295  lung]
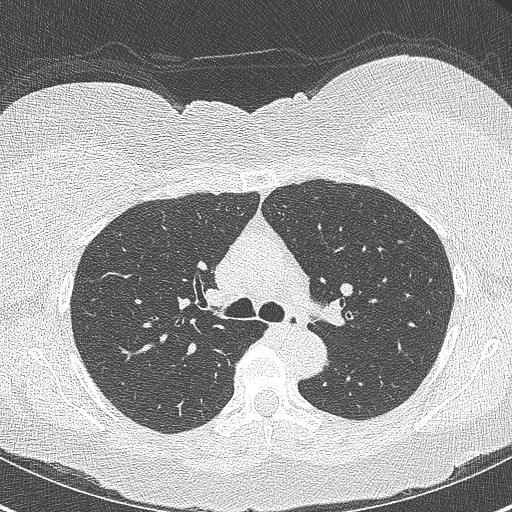
[im 201/295  mediastinal]
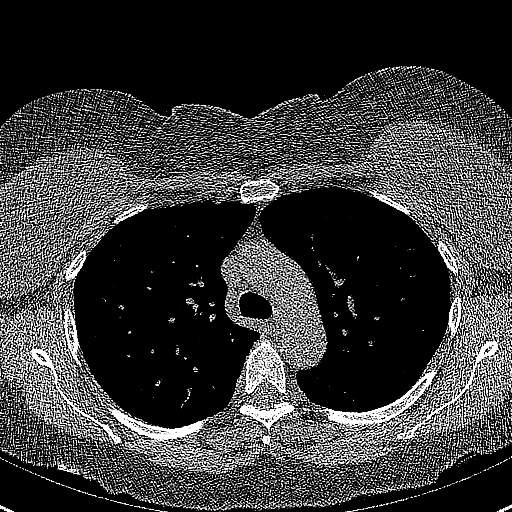
[im 201/295  lung]
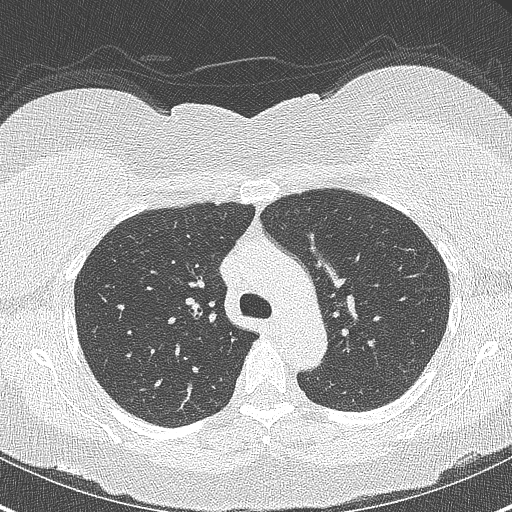
[im 228/295  lung]
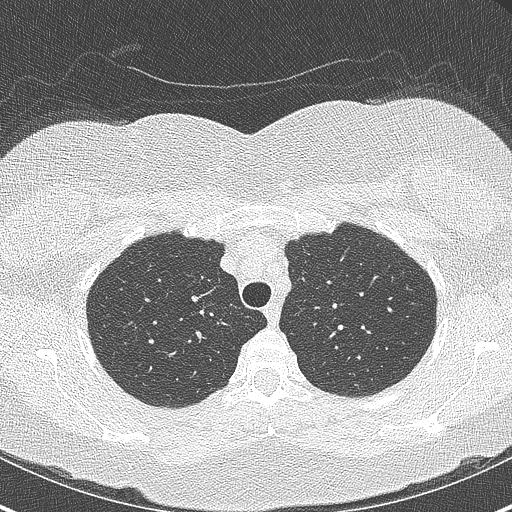
[im 254/295  lung]
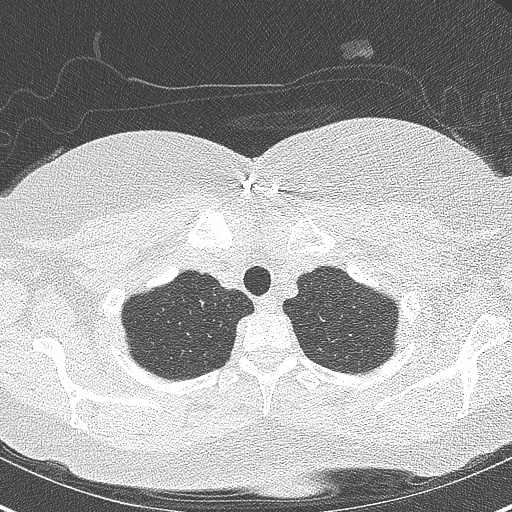
[im 281/295  lung]
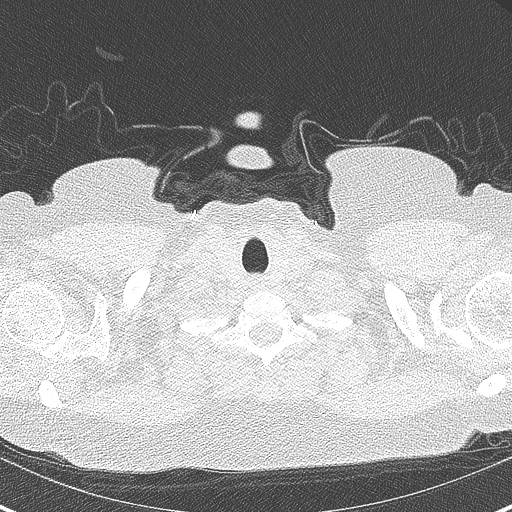

[Series 4: coronal · coronal · 0.59mm/px · 3 of 223 slices shown]
[im 45/223  lung]
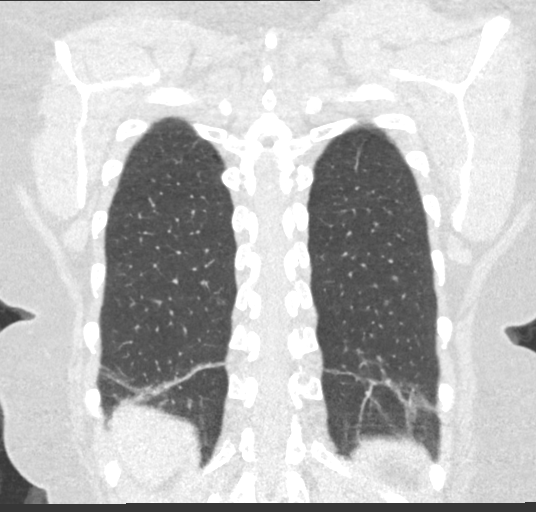
[im 89/223  lung]
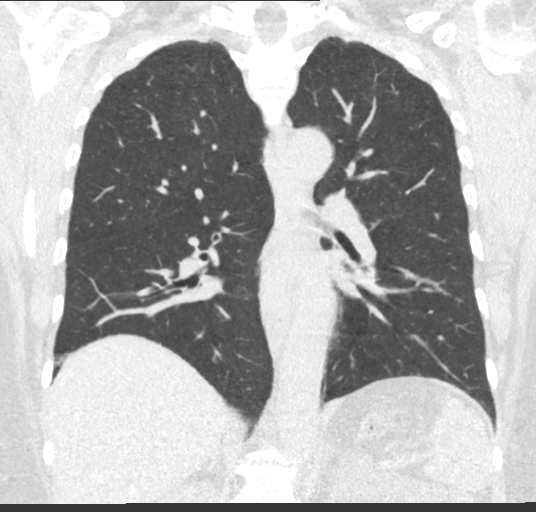
[im 134/223  lung]
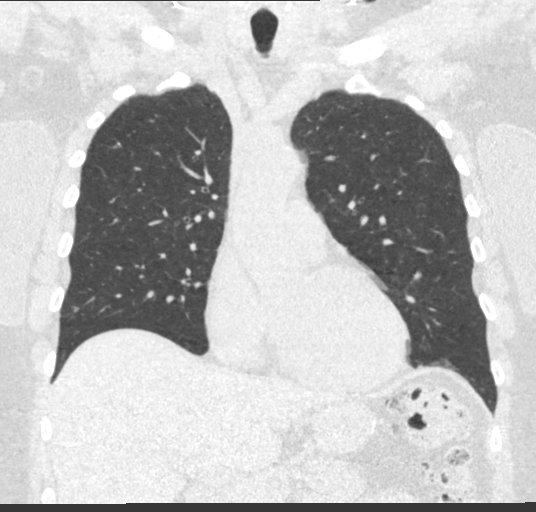

[15 of 36 positions shown; findings below may reference images not displayed]

FINDINGS: Cardiovascular: Normal heart size. No pericardial effusion. No
significant coronary artery calcifications.

Mediastinum/Nodes: Esophagus and thyroid are unremarkable. No
pathologically enlarged lymph nodes seen in the chest.

Lungs/Pleura: Central airways are patent. Mild linear opacities of
the lower lungs. No consolidation, pleural effusion or pneumothorax.
Solid pulmonary nodule the right upper lobe measuring 4.9 mm in mean
diameter on image 183, possibly calcified.

Upper Abdomen: Partially imaged cystic lesion seen in the left upper
quadrant, likely a renal cyst. Postsurgical changes of the stomach.
No acute abnormalities.

Musculoskeletal: No chest wall mass or suspicious bone lesions
identified.
IMPRESSION: Lung-RADS 2, benign appearance or behavior. Continue annual
screening with low-dose chest CT without contrast in 12 months.
